# Patient Record
Sex: Female | Born: 1950 | Race: White | Hispanic: No | Marital: Married | Smoking: Never smoker
Health system: Southern US, Community
[De-identification: ages and names within clinical notes are randomized; demographics above are authoritative.]

## PROBLEM LIST (undated history)

## (undated) DIAGNOSIS — M1990 Unspecified osteoarthritis, unspecified site: Secondary | ICD-10-CM

## (undated) DIAGNOSIS — M8580 Other specified disorders of bone density and structure, unspecified site: Secondary | ICD-10-CM

## (undated) DIAGNOSIS — E785 Hyperlipidemia, unspecified: Secondary | ICD-10-CM

## (undated) DIAGNOSIS — D259 Leiomyoma of uterus, unspecified: Secondary | ICD-10-CM

## (undated) DIAGNOSIS — E039 Hypothyroidism, unspecified: Secondary | ICD-10-CM

## (undated) HISTORY — DX: Hypothyroidism, unspecified: E03.9

## (undated) HISTORY — DX: Other specified disorders of bone density and structure, unspecified site: M85.80

## (undated) HISTORY — DX: Hyperlipidemia, unspecified: E78.5

## (undated) HISTORY — PX: THYROIDECTOMY, PARTIAL: SHX18

## (undated) HISTORY — DX: Unspecified osteoarthritis, unspecified site: M19.90

## (undated) HISTORY — DX: Leiomyoma of uterus, unspecified: D25.9

---

## 2008-04-24 ENCOUNTER — Encounter: Admission: RE | Admit: 2008-04-24 | Discharge: 2008-04-24 | Payer: Self-pay | Admitting: Family Medicine

## 2008-12-31 ENCOUNTER — Other Ambulatory Visit: Admission: RE | Admit: 2008-12-31 | Discharge: 2008-12-31 | Payer: Self-pay | Admitting: Family Medicine

## 2009-07-25 ENCOUNTER — Ambulatory Visit: Payer: Self-pay | Admitting: Radiology

## 2009-07-25 ENCOUNTER — Ambulatory Visit (HOSPITAL_BASED_OUTPATIENT_CLINIC_OR_DEPARTMENT_OTHER): Admission: RE | Admit: 2009-07-25 | Discharge: 2009-07-25 | Payer: Self-pay | Admitting: Nurse Practitioner

## 2010-03-19 ENCOUNTER — Ambulatory Visit: Payer: Self-pay | Admitting: Diagnostic Radiology

## 2010-03-19 ENCOUNTER — Ambulatory Visit (HOSPITAL_BASED_OUTPATIENT_CLINIC_OR_DEPARTMENT_OTHER): Admission: RE | Admit: 2010-03-19 | Discharge: 2010-03-19 | Payer: Self-pay | Admitting: Family Medicine

## 2010-09-26 IMAGING — CT CT CHEST W/ CM
2 of 3 series · 15 of 36 positions shown, 18 images · IV contrast (APPLIED)
Comparison: None.

CLINICAL DATA: Palpable abnormality in the right sternoclavicular
joint.  Prior thyroidectomy.

CT CHEST WITH CONTRAST
TECHNIQUE: Multidetector CT imaging of the chest was performed
following the standard protocol during bolus administration of
intravenous contrast.
Contrast: 100 ml Omniscan 300 IV contrast

[Series 2: chest 5.0 b31f · axial · 0.74mm/px · z∈[-335,-45]mm · 12 of 68 slices shown, 15 images]
[im 5/68  mediastinal]
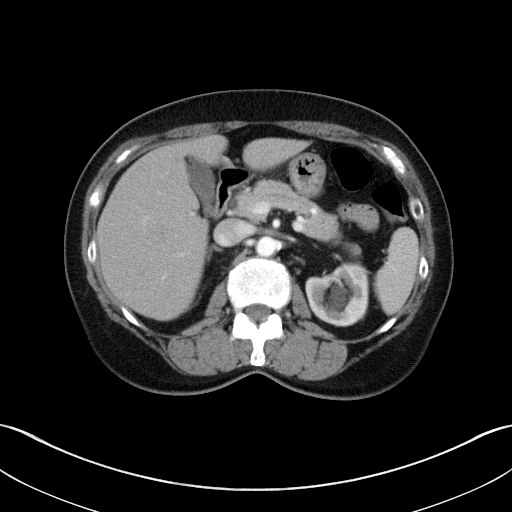
[im 5/68  lung]
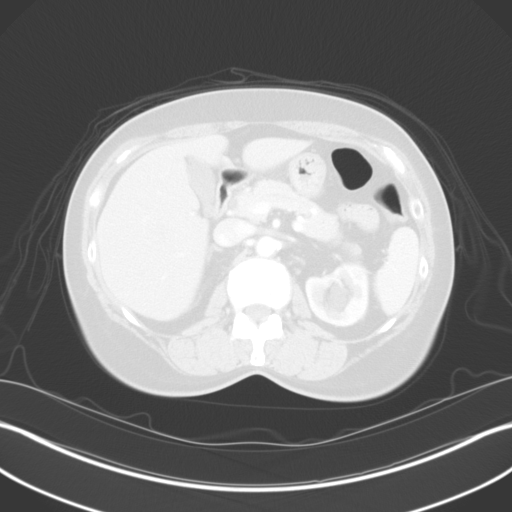
[im 10/68  lung]
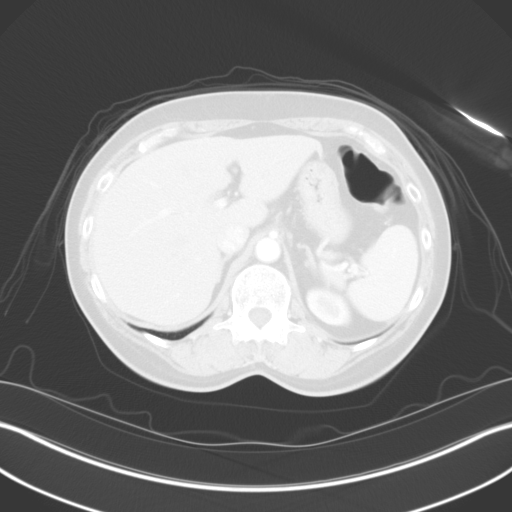
[im 15/68  lung]
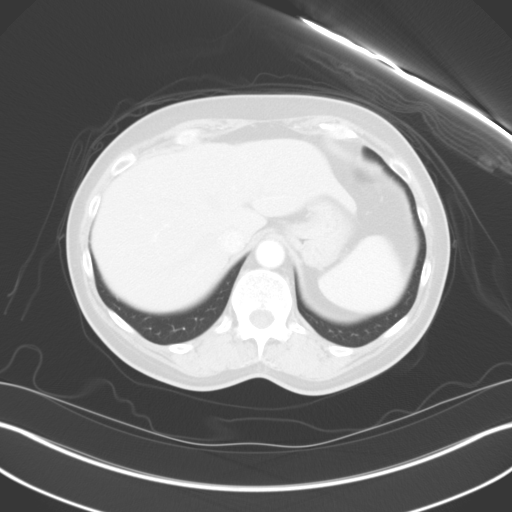
[im 20/68  lung]
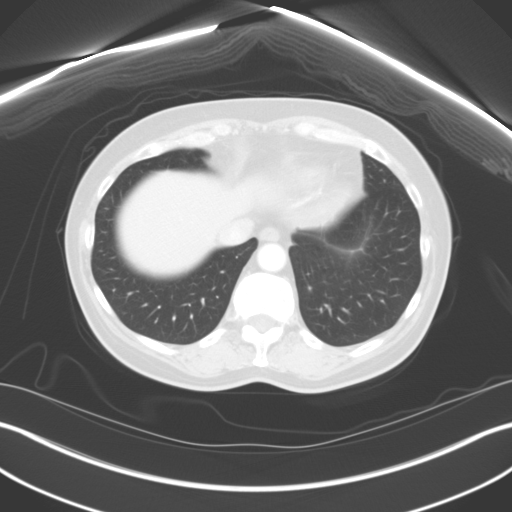
[im 25/68  mediastinal]
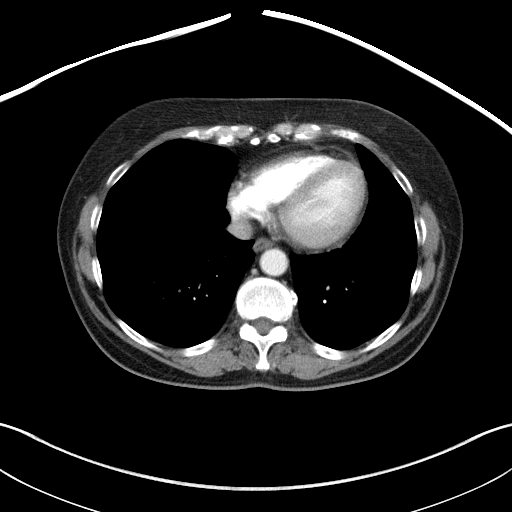
[im 25/68  lung]
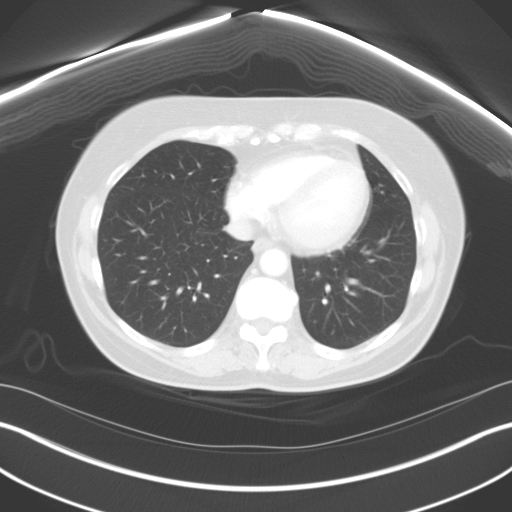
[im 30/68  lung]
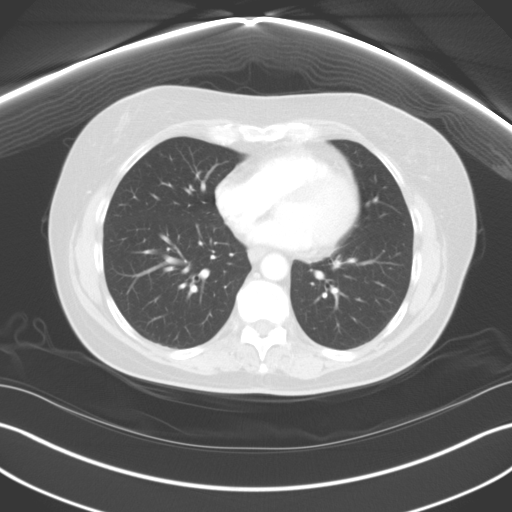
[im 38/68  lung]
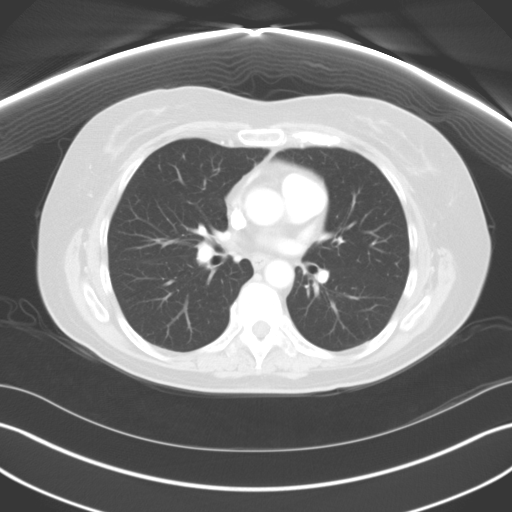
[im 43/68  lung]
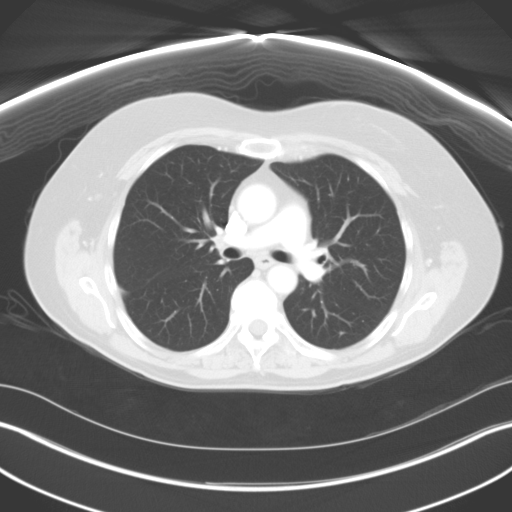
[im 48/68  mediastinal]
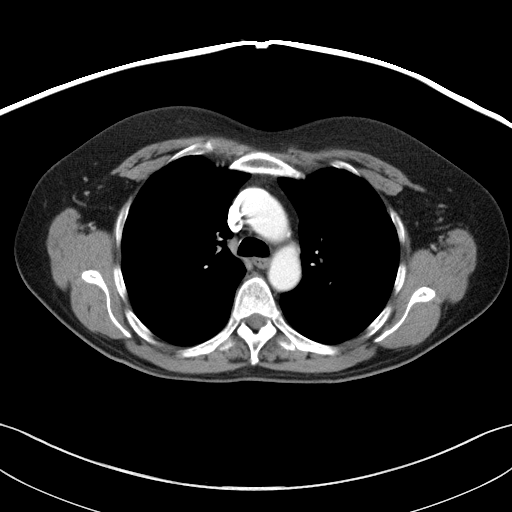
[im 48/68  lung]
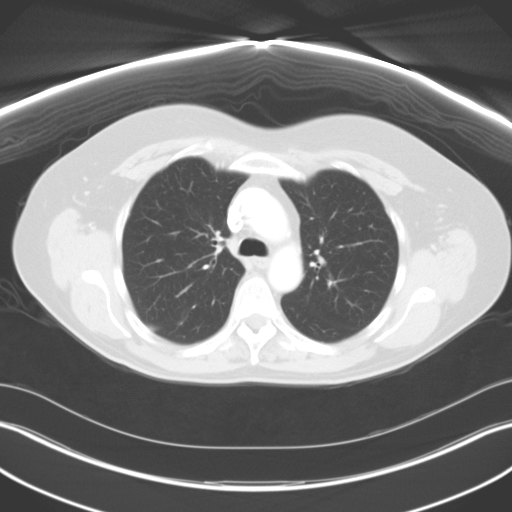
[im 53/68  lung]
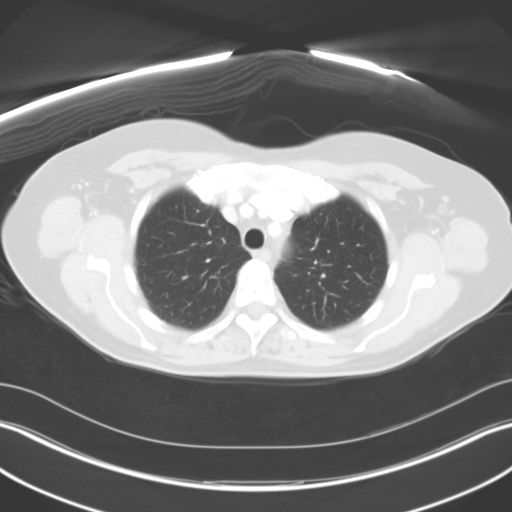
[im 58/68  lung]
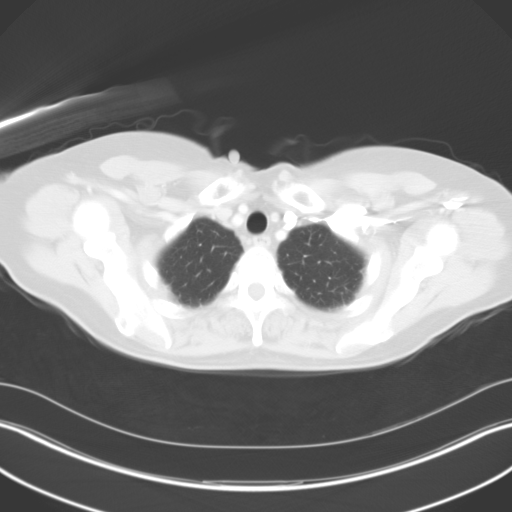
[im 63/68  lung]
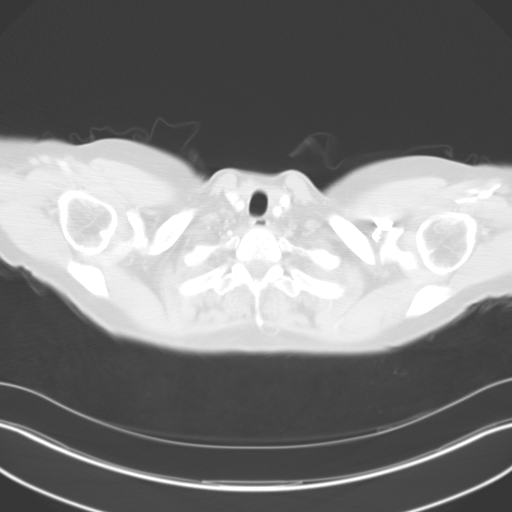

[Series 6: chest 3.0 coronal · coronal · 0.67mm/px · 3 of 75 slices shown]
[im 15/75  lung]
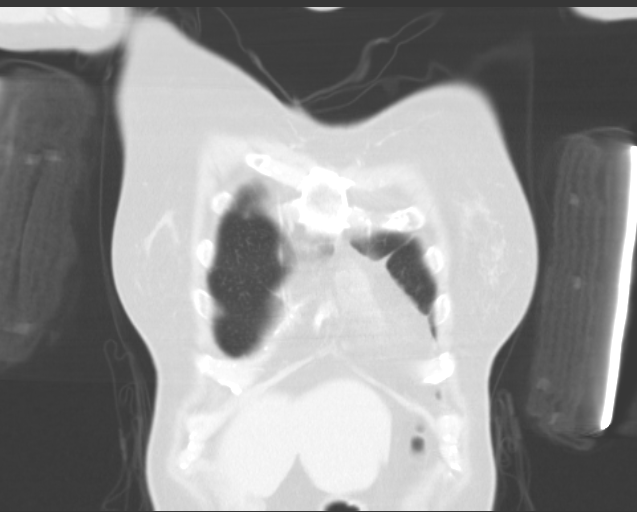
[im 30/75  lung]
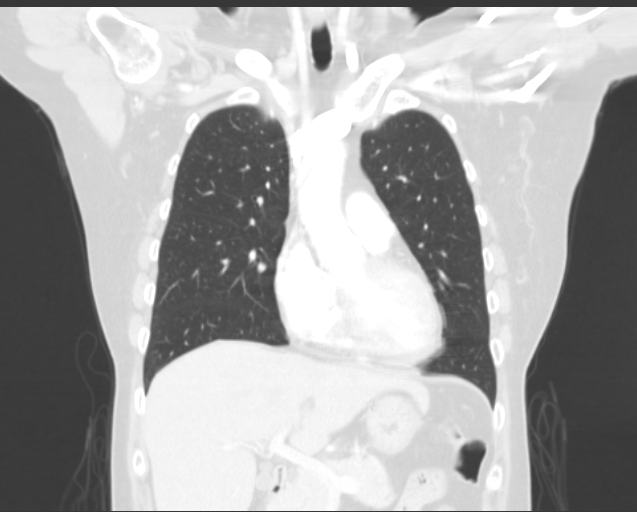
[im 45/75  lung]
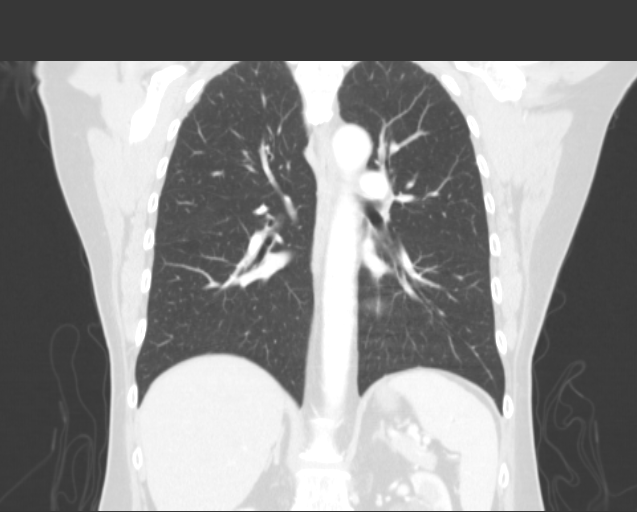

[15 of 36 positions shown; findings below may reference images not displayed]

FINDINGS: Clip in the right thyroidectomy bed region is at the
visualized.  The sternoclavicular joints are unremarkable.  No bony
abnormality is seen deep to the area of clinical concern as marked
by a vitamin E capsule. 2 mm nodule in the superior segment right
lower lobe is likely of no clinical consequence.  The left lung is
clear.  Heart size is normal.  No lymphadenopathy.  No pericardial
or pleural effusion.

Incomplete imaging of the upper abdomen demonstrates apparent
fullness of the left intrarenal collecting system, not evaluated.
IMPRESSION: No intrathoracic abnormality.  Specifically, the right
sternoclavicular joint at the site of the patient's questioned
palpable finding appears unremarkable.

Incomplete visualization of the left kidney demonstrates possible
prominence of the intrarenal collecting system.  Consider renal
ultrasound for further evaluation.

## 2011-01-20 ENCOUNTER — Other Ambulatory Visit: Payer: Self-pay | Admitting: Internal Medicine

## 2011-01-20 DIAGNOSIS — Z139 Encounter for screening, unspecified: Secondary | ICD-10-CM

## 2011-01-22 ENCOUNTER — Ambulatory Visit: Payer: 59 | Admitting: Internal Medicine

## 2011-01-22 ENCOUNTER — Other Ambulatory Visit: Payer: Self-pay | Admitting: Internal Medicine

## 2011-01-22 DIAGNOSIS — D179 Benign lipomatous neoplasm, unspecified: Secondary | ICD-10-CM

## 2011-01-22 DIAGNOSIS — R93429 Abnormal radiologic findings on diagnostic imaging of unspecified kidney: Secondary | ICD-10-CM

## 2011-01-28 ENCOUNTER — Ambulatory Visit (HOSPITAL_BASED_OUTPATIENT_CLINIC_OR_DEPARTMENT_OTHER)
Admission: RE | Admit: 2011-01-28 | Discharge: 2011-01-28 | Disposition: A | Discharge status: Home / Self Care | Payer: 59 | Source: Ambulatory Visit | Attending: Internal Medicine | Admitting: Internal Medicine

## 2011-01-28 ENCOUNTER — Ambulatory Visit (HOSPITAL_BASED_OUTPATIENT_CLINIC_OR_DEPARTMENT_OTHER): Payer: Self-pay

## 2011-01-28 ENCOUNTER — Ambulatory Visit (INDEPENDENT_AMBULATORY_CARE_PROVIDER_SITE_OTHER)
Admission: RE | Admit: 2011-01-28 | Discharge: 2011-01-28 | Disposition: A | Discharge status: Home / Self Care | Payer: 59 | Source: Ambulatory Visit | Attending: Internal Medicine | Admitting: Internal Medicine

## 2011-01-28 DIAGNOSIS — N289 Disorder of kidney and ureter, unspecified: Secondary | ICD-10-CM | POA: Insufficient documentation

## 2011-01-28 DIAGNOSIS — R9389 Abnormal findings on diagnostic imaging of other specified body structures: Secondary | ICD-10-CM

## 2011-01-28 DIAGNOSIS — Z1231 Encounter for screening mammogram for malignant neoplasm of breast: Secondary | ICD-10-CM | POA: Insufficient documentation

## 2011-01-28 DIAGNOSIS — Z139 Encounter for screening, unspecified: Secondary | ICD-10-CM

## 2011-01-28 DIAGNOSIS — R93429 Abnormal radiologic findings on diagnostic imaging of unspecified kidney: Secondary | ICD-10-CM

## 2011-02-03 ENCOUNTER — Other Ambulatory Visit: Payer: Self-pay | Admitting: Internal Medicine

## 2011-02-03 ENCOUNTER — Ambulatory Visit: Payer: 59 | Admitting: Internal Medicine

## 2011-02-03 ENCOUNTER — Other Ambulatory Visit: Payer: Self-pay

## 2011-02-03 DIAGNOSIS — E039 Hypothyroidism, unspecified: Secondary | ICD-10-CM

## 2011-02-03 DIAGNOSIS — M129 Arthropathy, unspecified: Secondary | ICD-10-CM

## 2011-02-03 DIAGNOSIS — Z1272 Encounter for screening for malignant neoplasm of vagina: Secondary | ICD-10-CM

## 2011-02-03 DIAGNOSIS — E785 Hyperlipidemia, unspecified: Secondary | ICD-10-CM

## 2011-02-03 DIAGNOSIS — Z113 Encounter for screening for infections with a predominantly sexual mode of transmission: Secondary | ICD-10-CM

## 2011-02-03 DIAGNOSIS — Z780 Asymptomatic menopausal state: Secondary | ICD-10-CM

## 2011-02-10 ENCOUNTER — Ambulatory Visit
Admission: RE | Admit: 2011-02-10 | Discharge: 2011-02-10 | Disposition: A | Discharge status: Home / Self Care | Payer: 59 | Source: Ambulatory Visit | Attending: Internal Medicine | Admitting: Internal Medicine

## 2011-02-10 ENCOUNTER — Other Ambulatory Visit: Payer: 59

## 2011-02-10 DIAGNOSIS — Z780 Asymptomatic menopausal state: Secondary | ICD-10-CM

## 2011-02-24 ENCOUNTER — Other Ambulatory Visit: Payer: Self-pay | Admitting: Internal Medicine

## 2011-04-15 ENCOUNTER — Encounter: Payer: Self-pay | Admitting: Internal Medicine

## 2011-12-31 ENCOUNTER — Telehealth: Payer: Self-pay | Admitting: Emergency Medicine

## 2011-12-31 DIAGNOSIS — Z1211 Encounter for screening for malignant neoplasm of colon: Secondary | ICD-10-CM

## 2011-12-31 NOTE — Telephone Encounter (Signed)
Yes OK for referral  Can you take care of setting this up?

## 2011-12-31 NOTE — Telephone Encounter (Signed)
Screening pre-op appointment with the nurse scheduled 01/28/12 @ 200/130pm arrival time.  Procedure date 02/04/12 @ 1030am with Dr. Rhetta Mura.  [name] aware of appt dates/times.  Number given to her in case she needs to reschedule appointment.

## 2011-12-31 NOTE — Telephone Encounter (Signed)
Tammy Spears called, stating it is time for her repeat colonoscopy.  Her last colon was 10 years ago in [place].  She would like to go to [facility] in [place].  Since she has not been there, I told her she would likely need a referral from our office.  She is due for CPE in March.  Okay to go ahead and make referral.  Tammy Spears would like to go ahead and have colon before CPE so you will have results at CPE.

## 2012-02-03 ENCOUNTER — Encounter: Payer: Self-pay | Admitting: Internal Medicine

## 2012-02-09 ENCOUNTER — Encounter: Payer: Self-pay | Admitting: Internal Medicine

## 2012-03-23 ENCOUNTER — Telehealth: Payer: Self-pay | Admitting: Internal Medicine

## 2012-03-23 NOTE — Telephone Encounter (Signed)
Called Tammy Spears and informed her we can order her blood work before her visit- but since physical isn't until 05/24/12 would prefer to wait until closer.  Tammy Spears states would like to get it done while she is out of work the week of June 16 , also mammogram- infomred her that would be fine- but too soon to put in orders now- so instructed her to call a few days ahead and we will put in the orders.  Tammy Spears voices understanding

## 2012-03-23 NOTE — Telephone Encounter (Signed)
Pt would like to get her blood work done before her physical on 05/24/2012. Pt's number [phone]

## 2012-04-21 ENCOUNTER — Other Ambulatory Visit: Payer: Self-pay | Admitting: Internal Medicine

## 2012-04-21 DIAGNOSIS — Z1231 Encounter for screening mammogram for malignant neoplasm of breast: Secondary | ICD-10-CM

## 2012-05-03 ENCOUNTER — Other Ambulatory Visit: Payer: Self-pay | Admitting: *Deleted

## 2012-05-03 MED ORDER — LEVOTHYROXINE SODIUM 100 MCG PO TABS: 100.0000 ug | ORAL_TABLET | Freq: Every day | ORAL | Status: DC

## 2012-05-03 NOTE — Telephone Encounter (Signed)
Pt called requesting a written RX for her Synthroid.  She is coming on 6/24 for fasting labs and her CPE is not until July.  She will run out before then.  She does want the RX to be a printed copy.

## 2012-05-09 ENCOUNTER — Ambulatory Visit (HOSPITAL_BASED_OUTPATIENT_CLINIC_OR_DEPARTMENT_OTHER)
Admission: RE | Admit: 2012-05-09 | Discharge: 2012-05-09 | Disposition: A | Discharge status: Home / Self Care | Payer: 59 | Source: Ambulatory Visit | Attending: Internal Medicine | Admitting: Internal Medicine

## 2012-05-09 ENCOUNTER — Other Ambulatory Visit: Payer: 59

## 2012-05-09 DIAGNOSIS — Z1231 Encounter for screening mammogram for malignant neoplasm of breast: Secondary | ICD-10-CM | POA: Insufficient documentation

## 2012-05-09 DIAGNOSIS — E039 Hypothyroidism, unspecified: Secondary | ICD-10-CM

## 2012-05-09 DIAGNOSIS — Z01419 Encounter for gynecological examination (general) (routine) without abnormal findings: Secondary | ICD-10-CM

## 2012-05-09 LAB — COMPLETE METABOLIC PANEL WITH GFR
ALT: 19 U/L (ref 0–35)
AST: 19 U/L (ref 0–37)
Albumin: 4.6 g/dL (ref 3.5–5.2)
Calcium: 9.4 mg/dL (ref 8.4–10.5)
GFR, Est Non African American: 70 mL/min
Potassium: 4.4 mEq/L (ref 3.5–5.3)
Sodium: 140 mEq/L (ref 135–145)
Total Protein: 7 g/dL (ref 6.0–8.3)

## 2012-05-09 LAB — CBC WITH DIFFERENTIAL/PLATELET
Basophils Absolute: 0 10*3/uL (ref 0.0–0.1)
Basophils Relative: 1 % (ref 0–1)
HCT: 40.4 % (ref 36.0–46.0)
Lymphs Abs: 1.3 10*3/uL (ref 0.7–4.0)
MCHC: 33.9 g/dL (ref 30.0–36.0)
MCV: 94.6 fL (ref 78.0–100.0)
Monocytes Relative: 7 % (ref 3–12)
Neutro Abs: 2.2 10*3/uL (ref 1.7–7.7)
RBC: 4.27 MIL/uL (ref 3.87–5.11)
RDW: 13.8 % (ref 11.5–15.5)
WBC: 3.8 10*3/uL — ABNORMAL LOW (ref 4.0–10.5)

## 2012-05-09 LAB — LIPID PANEL
Cholesterol: 216 mg/dL — ABNORMAL HIGH (ref 0–200)
LDL Cholesterol: 139 mg/dL — ABNORMAL HIGH (ref 0–99)
VLDL: 23 mg/dL (ref 0–40)

## 2012-05-09 MED ORDER — LEVOTHYROXINE SODIUM 100 MCG PO TABS: 100.0000 ug | ORAL_TABLET | Freq: Every day | ORAL | Status: DC

## 2012-05-09 NOTE — Addendum Note (Signed)
Addended by: Granville Lewis on: 05/09/2012 10:13 AM   Modules accepted: Orders

## 2012-05-10 ENCOUNTER — Encounter: Payer: Self-pay | Admitting: *Deleted

## 2012-05-10 LAB — VITAMIN D 25 HYDROXY (VIT D DEFICIENCY, FRACTURES): Vit D, 25-Hydroxy: 41 ng/mL (ref 30–89)

## 2012-05-24 ENCOUNTER — Telehealth: Payer: Self-pay | Admitting: Internal Medicine

## 2012-05-24 ENCOUNTER — Ambulatory Visit (INDEPENDENT_AMBULATORY_CARE_PROVIDER_SITE_OTHER): Payer: 59 | Admitting: Internal Medicine

## 2012-05-24 ENCOUNTER — Encounter: Payer: Self-pay | Admitting: Internal Medicine

## 2012-05-24 VITALS — BP 110/70 | HR 64 | Temp 97.6°F | Resp 16 | Ht 62.0 in | Wt 131.0 lb

## 2012-05-24 DIAGNOSIS — E785 Hyperlipidemia, unspecified: Secondary | ICD-10-CM | POA: Insufficient documentation

## 2012-05-24 DIAGNOSIS — M8580 Other specified disorders of bone density and structure, unspecified site: Secondary | ICD-10-CM

## 2012-05-24 DIAGNOSIS — D219 Benign neoplasm of connective and other soft tissue, unspecified: Secondary | ICD-10-CM | POA: Insufficient documentation

## 2012-05-24 DIAGNOSIS — Z0000 Encounter for general adult medical examination without abnormal findings: Secondary | ICD-10-CM

## 2012-05-24 DIAGNOSIS — D259 Leiomyoma of uterus, unspecified: Secondary | ICD-10-CM

## 2012-05-24 DIAGNOSIS — E039 Hypothyroidism, unspecified: Secondary | ICD-10-CM

## 2012-05-24 DIAGNOSIS — M899 Disorder of bone, unspecified: Secondary | ICD-10-CM

## 2012-05-24 DIAGNOSIS — Z23 Encounter for immunization: Secondary | ICD-10-CM

## 2012-05-24 DIAGNOSIS — Z01419 Encounter for gynecological examination (general) (routine) without abnormal findings: Secondary | ICD-10-CM

## 2012-05-24 DIAGNOSIS — M1990 Unspecified osteoarthritis, unspecified site: Secondary | ICD-10-CM | POA: Insufficient documentation

## 2012-05-24 LAB — POCT URINALYSIS DIPSTICK
Bilirubin, UA: NEGATIVE
Glucose, UA: NEGATIVE
Ketones, UA: NEGATIVE
Leukocytes, UA: NEGATIVE
Nitrite, UA: NEGATIVE
Spec Grav, UA: 1.02
Urobilinogen, UA: NEGATIVE
pH, UA: 6

## 2012-05-24 MED ORDER — TETANUS-DIPHTH-ACELL PERTUSSIS 5-2.5-18.5 LF-MCG/0.5 IM SUSP: 0.5000 mL | Freq: Once | INTRAMUSCULAR | Status: AC

## 2012-05-24 NOTE — Patient Instructions (Addendum)
See me as needed 

## 2012-05-24 NOTE — Progress Notes (Signed)
Subjective:    Patient ID: Tammy Spears, female    DOB: 07-Aug-1951, 62 y.o.   MRN: [id number]  HPI Tammy Spears is here for comprehensive exam.  Overall doing well.  She is caring for 66 grandsons youngest age 27 months.  She has not had a pertussis vaccine as yet.  Tetanus done 4 years ago.    Has scaly rash around nose and scalp.  Dermatologist Dr. Massie Maroon advised to use Head and Shoulders shampoo .  She has upcoming follow up with him.    See labs.  Eating well .  Does not wish any meds for LDL.  She has mild elevation  No Known Allergies Past Medical History  Diagnosis Date  . Hypothyroidism   . Arthritis   . Uterine fibroid   . Hyperlipidemia   . Osteopenia    Past Surgical History  Procedure Date  . Thyroidectomy, partial     goiter   History   Social History  . Marital Status: Married    Spouse Name: Merlyn Albert    Number of Children: 2  . Years of Education: Masters   Occupational History  . Retired    Social History Main Topics  . Smoking status: Never Smoker   . Smokeless tobacco: Never Used  . Alcohol Use: 1.2 oz/week    2 Glasses of wine per week  . Drug Use: No  . Sexually Active: Yes -- Female partner(s)   Other Topics Concern  . Not on file   Social History Narrative  . No narrative on file   Family History  Problem Relation Age of Onset  . Heart failure Mother   . Breast cancer Mother   . Heart attack Father    There is no problem list on file for this patient.  Current Outpatient Prescriptions on File Prior to Visit  Medication Sig Dispense Refill  . Calcium Carbonate-Vitamin D (CALCIUM 500 + D PO) Take 1 tablet by mouth daily.        Marland Kitchen glucosamine-chondroitin 500-400 MG tablet Take 1 tablet by mouth daily.        Marland Kitchen levothyroxine (SYNTHROID, LEVOTHROID) 100 MCG tablet Take 1 tablet (100 mcg total) by mouth daily.  30 tablet  1  . Multiple Vitamin (MULTIVITAMIN) tablet Take 1 tablet by mouth daily.            Review of Systems  Skin: Positive for  rash.  All other systems reviewed and are negative.       Objective:   Physical Exam Physical Exam  Nursing note and vitals reviewed.  Constitutional: She is oriented to person, place, and time. She appears well-developed and well-nourished.  HENT:  Head: Normocephalic and atraumatic.  Right Ear: Tympanic membrane and ear canal normal. No drainage. Tympanic membrane is not injected and not erythematous.  Left Ear: Tympanic membrane and ear canal normal. No drainage. Tympanic membrane is not injected and not erythematous.  Nose: Nose normal. Right sinus exhibits no maxillary sinus tenderness and no frontal sinus tenderness. Left sinus exhibits no maxillary sinus tenderness and no frontal sinus tenderness.  Mouth/Throat: Oropharynx is clear and moist. No oral lesions. No oropharyngeal exudate.  Eyes: Conjunctivae and EOM are normal. Pupils are equal, round, and reactive to light.  Neck: Normal range of motion. Neck supple. No JVD present. Carotid bruit is not present. No mass and no thyromegaly present.  Cardiovascular: Normal rate, regular rhythm, S1 normal, S2 normal and intact distal pulses. Exam reveals no gallop and no friction  rub.  No murmur heard.  Pulses:  Carotid pulses are 2+ on the right side, and 2+ on the left side.  Dorsalis pedis pulses are 2+ on the right side, and 2+ on the left side.  No carotid bruit. No LE edema  Pulmonary/Chest: Breath sounds normal. She has no wheezes. She has no rales. She exhibits no tenderness. Breast no discrete masses no nipple discharge no axillary adenopathy bilaterally Abdominal: Soft. Bowel sounds are normal. She exhibits no distension and no mass. There is no hepatosplenomegaly. There is no tenderness. There is no CVA tenderness.  Pelvic bimanual only  No adnexal masses or tenderness   Musculoskeletal: Normal range of motion.  No active synovitis to joints.  Lymphadenopathy:  She has no cervical adenopathy.  She has no axillary  adenopathy.  Right: No inguinal and no supraclavicular adenopathy present.  Left: No inguinal and no supraclavicular adenopathy present.  Neurological: She is alert and oriented to person, place, and time. She has normal strength and normal reflexes. She displays no tremor. No cranial nerve deficit or sensory deficit. Coordination and gait normal.  Skin: Skin is warm and dry. No rash noted. No cyanosis. Nails show no clubbing. Few scaly lesions around nose Psychiatric: She has a normal mood and affect. Her speech is normal and behavior is normal. Cognition and memory are normal.           Assessment & Plan:  Health maintenance .  Since caregiver for 40 month old will give Tdap today.  Advised to ice, Ibuprofen prn  Hypothtyroidism  TSH normal  Scaly lesion nares  Follow up with dermatologist possible seb der  Hyperlipidemia  Minimal dash diet given  DJD  Osteopenia calcium vitamin D  Due for dexa  2014  Spent 45 mintues with pt

## 2012-05-24 NOTE — Telephone Encounter (Signed)
Tammy Spears  Please get Dr. Rhetta Mura colonoscopy report done in may  Thanks

## 2012-07-19 ENCOUNTER — Other Ambulatory Visit: Payer: Self-pay | Admitting: Internal Medicine

## 2012-07-19 DIAGNOSIS — E039 Hypothyroidism, unspecified: Secondary | ICD-10-CM

## 2012-07-19 MED ORDER — LEVOTHYROXINE SODIUM 100 MCG PO TABS: 100.0000 ug | ORAL_TABLET | Freq: Every day | ORAL | Status: DC

## 2012-07-19 NOTE — Telephone Encounter (Signed)
Pt states she only has ONE Levothyroxine Sodium (Tab) SYNTHROID, LEVOTHROID 100 MCG Take 1 tablet (100 mcg total) by mouth daily.  Left... She needs  Year prescription and would like if the doctor can prescribe [facility] day at [facility] at [facility]... THANKS

## 2012-07-20 ENCOUNTER — Other Ambulatory Visit: Payer: Self-pay | Admitting: *Deleted

## 2012-07-20 DIAGNOSIS — E039 Hypothyroidism, unspecified: Secondary | ICD-10-CM

## 2012-07-20 NOTE — Telephone Encounter (Signed)
Tammy Spears  I did this yesterday

## 2012-07-20 NOTE — Telephone Encounter (Signed)
Called pt RX for synthroid into [facility]

## 2012-07-20 NOTE — Telephone Encounter (Signed)
Pt requesting a rx synthroid x 12 refills in 3 month increments for insurance purposes

## 2012-08-01 ENCOUNTER — Telehealth: Payer: Self-pay | Admitting: *Deleted

## 2012-08-01 NOTE — Telephone Encounter (Signed)
Refill  Completed by Dr Constance Goltz

## 2012-08-03 NOTE — Telephone Encounter (Signed)
Medication refill completed by MD

## 2012-10-17 ENCOUNTER — Other Ambulatory Visit: Payer: Self-pay | Admitting: Internal Medicine

## 2012-10-17 NOTE — Telephone Encounter (Signed)
Tammy Spears  Call pt and confirm the pharmacy she goes to.  I ordered 90 tablets with 3 refills on 9/30

## 2012-10-17 NOTE — Telephone Encounter (Signed)
PT WOULD LIKE TO HAVE HER Levothyroxine Sodium (Tab) SYNTHROID, LEVOTHROID 100 MCG Take 1 tablet (100 mcg total) by mouth daily. REFILL... SHE STATES THERE HAVE BEEN FAXES SENT TO OUR OFFICE.Marland Kitchen   Pt is not happy with having to go thru this again... Please touch base with pt.. Thanks

## 2012-10-18 ENCOUNTER — Other Ambulatory Visit: Payer: Self-pay | Admitting: *Deleted

## 2013-04-18 ENCOUNTER — Other Ambulatory Visit: Payer: Self-pay | Admitting: *Deleted

## 2013-04-18 DIAGNOSIS — E039 Hypothyroidism, unspecified: Secondary | ICD-10-CM

## 2013-04-18 DIAGNOSIS — E785 Hyperlipidemia, unspecified: Secondary | ICD-10-CM

## 2013-04-18 DIAGNOSIS — M8580 Other specified disorders of bone density and structure, unspecified site: Secondary | ICD-10-CM

## 2013-04-18 LAB — CBC WITH DIFFERENTIAL/PLATELET
Basophils Absolute: 0 10*3/uL (ref 0.0–0.1)
Basophils Relative: 1 % (ref 0–1)
Eosinophils Absolute: 0.1 10*3/uL (ref 0.0–0.7)
Eosinophils Relative: 1 % (ref 0–5)
HCT: 40.8 % (ref 36.0–46.0)
Hemoglobin: 14.2 g/dL (ref 12.0–15.0)
Lymphs Abs: 1.3 10*3/uL (ref 0.7–4.0)
MCH: 31.8 pg (ref 26.0–34.0)
MCHC: 34.8 g/dL (ref 30.0–36.0)
MCV: 91.3 fL (ref 78.0–100.0)
Neutrophils Relative %: 54 % (ref 43–77)
RDW: 13.3 % (ref 11.5–15.5)

## 2013-04-18 LAB — TSH: TSH: 0.886 u[IU]/mL (ref 0.350–4.500)

## 2013-04-18 LAB — COMPREHENSIVE METABOLIC PANEL
ALT: 17 U/L (ref 0–35)
AST: 17 U/L (ref 0–37)
Alkaline Phosphatase: 80 U/L (ref 39–117)
Chloride: 105 mEq/L (ref 96–112)
Glucose, Bld: 77 mg/dL (ref 70–99)
Potassium: 4.4 mEq/L (ref 3.5–5.3)
Total Bilirubin: 0.4 mg/dL (ref 0.3–1.2)

## 2013-04-18 LAB — LIPID PANEL
Cholesterol: 201 mg/dL — ABNORMAL HIGH (ref 0–200)
VLDL: 21 mg/dL (ref 0–40)

## 2013-04-19 LAB — VITAMIN D 25 HYDROXY (VIT D DEFICIENCY, FRACTURES): Vit D, 25-Hydroxy: 50 ng/mL (ref 30–89)

## 2013-04-24 ENCOUNTER — Encounter: Payer: Self-pay | Admitting: Internal Medicine

## 2013-04-24 ENCOUNTER — Ambulatory Visit (HOSPITAL_BASED_OUTPATIENT_CLINIC_OR_DEPARTMENT_OTHER): Payer: 59

## 2013-04-24 ENCOUNTER — Ambulatory Visit (INDEPENDENT_AMBULATORY_CARE_PROVIDER_SITE_OTHER): Payer: 59 | Admitting: Internal Medicine

## 2013-04-24 ENCOUNTER — Other Ambulatory Visit: Payer: Self-pay | Admitting: Internal Medicine

## 2013-04-24 VITALS — BP 121/61 | HR 60 | Temp 97.9°F | Resp 18 | Wt 133.0 lb

## 2013-04-24 DIAGNOSIS — Z0000 Encounter for general adult medical examination without abnormal findings: Secondary | ICD-10-CM

## 2013-04-24 DIAGNOSIS — E785 Hyperlipidemia, unspecified: Secondary | ICD-10-CM

## 2013-04-24 DIAGNOSIS — R0789 Other chest pain: Secondary | ICD-10-CM

## 2013-04-24 DIAGNOSIS — M1990 Unspecified osteoarthritis, unspecified site: Secondary | ICD-10-CM

## 2013-04-24 DIAGNOSIS — E039 Hypothyroidism, unspecified: Secondary | ICD-10-CM

## 2013-04-24 DIAGNOSIS — M899 Disorder of bone, unspecified: Secondary | ICD-10-CM

## 2013-04-24 DIAGNOSIS — Z1151 Encounter for screening for human papillomavirus (HPV): Secondary | ICD-10-CM

## 2013-04-24 DIAGNOSIS — M8580 Other specified disorders of bone density and structure, unspecified site: Secondary | ICD-10-CM

## 2013-04-24 DIAGNOSIS — Z124 Encounter for screening for malignant neoplasm of cervix: Secondary | ICD-10-CM

## 2013-04-24 DIAGNOSIS — Z803 Family history of malignant neoplasm of breast: Secondary | ICD-10-CM

## 2013-04-24 DIAGNOSIS — M949 Disorder of cartilage, unspecified: Secondary | ICD-10-CM

## 2013-04-24 DIAGNOSIS — Z139 Encounter for screening, unspecified: Secondary | ICD-10-CM

## 2013-04-24 LAB — POCT URINALYSIS DIPSTICK
Blood, UA: NEGATIVE
Ketones, UA: NEGATIVE
Protein, UA: NEGATIVE
Spec Grav, UA: 1.015
Urobilinogen, UA: NEGATIVE

## 2013-04-24 MED ORDER — LEVOTHYROXINE SODIUM 100 MCG PO TABS: 100.0000 ug | ORAL_TABLET | Freq: Every day | ORAL | Status: DC

## 2013-04-24 NOTE — Patient Instructions (Addendum)
See me as needed  Will schedule DEXA  Will refer to cardiology

## 2013-04-24 NOTE — Progress Notes (Signed)
Subjective:    Patient ID: Tammy Spears, female    DOB: 02/15/1951, 62 y.o.   MRN: [id number]  HPI Dennie Bible is here for CPE.  She reports she had episode of palpitaitons and chest pressure several months ago.  Non exertional.  Symptoms on and off for 1 and 1/2 days.   She did not seek medical attention at that time -   Attributed symptoms to stress (younger brother had CVA and brother in law had bladder cancer).  No recent symptoms  Risk profile  She is a non-smoker.  Father had MI in his 32's, younger brother CVA.  She has mild hyperlipidemia  And does not want medication for this.    She still care for 3 grandsons age 76 year to 6 years.    Both mother ( age 82) and recently her niece  (age 44) was diagnosed with breast cancer   No Known Allergies Past Medical History  Diagnosis Date  . Hypothyroidism   . Arthritis   . Uterine fibroid   . Hyperlipidemia   . Osteopenia    Past Surgical History  Procedure Laterality Date  . Thyroidectomy, partial      goiter   History   Social History  . Marital Status: Married    Spouse Name: Merlyn Albert    Number of Children: 2  . Years of Education: Masters   Occupational History  . Retired    Social History Main Topics  . Smoking status: Never Smoker   . Smokeless tobacco: Never Used  . Alcohol Use: 1.2 oz/week    2 Glasses of wine per week  . Drug Use: No  . Sexually Active: Yes -- Female partner(s)   Other Topics Concern  . Not on file   Social History Narrative  . No narrative on file   Family History  Problem Relation Age of Onset  . Heart failure Mother   . Breast cancer Mother   . Heart attack Father    Patient Active Problem List   Diagnosis Date Noted  . Unspecified hypothyroidism 05/24/2012  . Fibroids 05/24/2012  . Other and unspecified hyperlipidemia 05/24/2012  . DJD (degenerative joint disease) 05/24/2012  . Osteopenia 05/24/2012   Current Outpatient Prescriptions on File Prior to Visit  Medication Sig  Dispense Refill  . Calcium Carbonate-Vitamin D (CALCIUM 500 + D PO) Take 1 tablet by mouth daily.        . fish oil-omega-3 fatty acids 1000 MG capsule Take 1 g by mouth daily.      Marland Kitchen glucosamine-chondroitin 500-400 MG tablet Take 1 tablet by mouth daily.        Marland Kitchen levothyroxine (SYNTHROID, LEVOTHROID) 100 MCG tablet Take 1 tablet (100 mcg total) by mouth daily.  90 tablet  3  . Multiple Vitamin (MULTIVITAMIN) tablet Take 1 tablet by mouth daily.         Current Facility-Administered Medications on File Prior to Visit  Medication Dose Route Frequency Provider Last Rate Last Dose  . TDaP (BOOSTRIX) injection 0.5 mL  0.5 mL Intramuscular Once Kendrick Ranch, MD           Review of Systems See HPI    Objective:   Physical Exam Physical Exam  Vital signs and nursing note reviewed  Constitutional: She is oriented to person, place, and time. She appears well-developed and well-nourished. She is cooperative.  HENT:  Head: Normocephalic and atraumatic.  Right Ear: Tympanic membrane normal.  Left Ear: Tympanic membrane normal.  Nose: Nose  normal.  Mouth/Throat: Oropharynx is clear and moist and mucous membranes are normal. No oropharyngeal exudate or posterior oropharyngeal erythema.  Eyes: Conjunctivae and EOM are normal. Pupils are equal, round, and reactive to light.  Neck: Neck supple. No JVD present. Carotid bruit is not present. No mass and no thyromegaly present.  Cardiovascular: Regular rhythm, normal heart sounds, intact distal pulses and normal pulses.  Exam reveals no gallop and no friction rub.   No murmur heard. Pulses:      Dorsalis pedis pulses are 2+ on the right side, and 2+ on the left side.  Pulmonary/Chest: Breath sounds normal. She has no wheezes. She has no rhonchi. She has no rales. Right breast exhibits no mass, no nipple discharge and no skin change. Left breast exhibits no mass, no nipple discharge and no skin change.  Abdominal: Soft. Bowel sounds are  normal. She exhibits no distension and no mass. There is no hepatosplenomegaly. There is no tenderness. There is no CVA tenderness.  Genitourinary: Rectum normal, vagina normal and uterus normal. Rectal exam shows no mass. Guaiac negative stool. No labial fusion. There is no lesion on the right labia. There is no lesion on the left labia. Cervix exhibits no motion tenderness. Right adnexum displays no mass, no tenderness and no fullness. Left adnexum displays no mass, no tenderness and no fullness. No erythema around the vagina.  Musculoskeletal:       No active synovitis to any joint.   DJD changes both hands Lymphadenopathy:       Right cervical: No superficial cervical adenopathy present.      Left cervical: No superficial cervical adenopathy present.       Right axillary: No pectoral and no lateral adenopathy present.       Left axillary: No pectoral and no lateral adenopathy present.      Right: No inguinal adenopathy present.       Left: No inguinal adenopathy present.  Neurological: She is alert and oriented to person, place, and time. She has normal strength and normal reflexes. No cranial nerve deficit or sensory deficit. She displays a negative Romberg sign. Coordination and gait normal.  Skin: Skin is warm and dry. No abrasion, no bruising, no ecchymosis and no rash noted. No cyanosis. Nails show no clubbing.  Psychiatric: She has a normal mood and affect. Her speech is normal and behavior is normal.          Assessment & Plan:   Health Maintenance:  MM and pap today.  Discussed Zostavax.  DASH diet given.  Will schedule  DEXA.   Advised 81 mg coated ASA  FH of breast ca in mother and neice  MM today and advised not to skip her MM.  Given info on BRCA testing  She wishes to check with [facility] regarding cost   Hypothyroidism:  TSH normal continue meds  Atypical  chest pressure  EKG norma today with sinus bradycardia.  Will refer to cardiology for risk stratification.  Advised to  begin 81 mg coated ASA daily and to get urgent eval if chest synptoms return  Osteopenia  Advised calcium Vitamin D. Will schedule DEXA  DJD:  Asymptomatic now     Assessment & Plan:

## 2013-04-26 ENCOUNTER — Encounter: Payer: Self-pay | Admitting: *Deleted

## 2013-05-08 ENCOUNTER — Ambulatory Visit: Payer: 59 | Admitting: Cardiovascular Disease

## 2013-05-30 ENCOUNTER — Ambulatory Visit (HOSPITAL_COMMUNITY): Payer: 59

## 2013-05-31 ENCOUNTER — Ambulatory Visit (HOSPITAL_BASED_OUTPATIENT_CLINIC_OR_DEPARTMENT_OTHER)
Admission: RE | Admit: 2013-05-31 | Discharge: 2013-05-31 | Disposition: A | Discharge status: Home / Self Care | Payer: 59 | Source: Ambulatory Visit | Attending: Internal Medicine | Admitting: Internal Medicine

## 2013-05-31 DIAGNOSIS — Z1231 Encounter for screening mammogram for malignant neoplasm of breast: Secondary | ICD-10-CM | POA: Insufficient documentation

## 2013-05-31 DIAGNOSIS — Z139 Encounter for screening, unspecified: Secondary | ICD-10-CM

## 2013-06-01 ENCOUNTER — Ambulatory Visit: Payer: 59 | Admitting: Cardiovascular Disease

## 2013-06-05 ENCOUNTER — Ambulatory Visit (HOSPITAL_COMMUNITY): Payer: 59

## 2013-06-07 ENCOUNTER — Ambulatory Visit (HOSPITAL_COMMUNITY): Payer: 59

## 2013-06-08 ENCOUNTER — Encounter: Payer: Self-pay | Admitting: *Deleted

## 2013-06-16 ENCOUNTER — Ambulatory Visit: Payer: 59 | Admitting: Cardiovascular Disease

## 2013-08-22 ENCOUNTER — Ambulatory Visit (HOSPITAL_COMMUNITY)
Admission: RE | Admit: 2013-08-22 | Discharge: 2013-08-22 | Disposition: A | Discharge status: Home / Self Care | Payer: 59 | Source: Ambulatory Visit | Attending: Internal Medicine | Admitting: Internal Medicine

## 2013-08-22 ENCOUNTER — Encounter: Payer: Self-pay | Admitting: Internal Medicine

## 2013-08-22 DIAGNOSIS — Z13820 Encounter for screening for osteoporosis: Secondary | ICD-10-CM | POA: Insufficient documentation

## 2013-08-22 DIAGNOSIS — Z780 Asymptomatic menopausal state: Secondary | ICD-10-CM | POA: Insufficient documentation

## 2013-08-22 DIAGNOSIS — Z9289 Personal history of other medical treatment: Secondary | ICD-10-CM | POA: Insufficient documentation

## 2013-08-22 DIAGNOSIS — M8580 Other specified disorders of bone density and structure, unspecified site: Secondary | ICD-10-CM

## 2013-08-24 ENCOUNTER — Telehealth: Payer: Self-pay | Admitting: *Deleted

## 2013-08-24 NOTE — Telephone Encounter (Signed)
Notified pt of bone density results via VM

## 2013-08-24 NOTE — Telephone Encounter (Signed)
Message copied by Mathews Robinsons on Thu Aug 24, 2013  4:22 PM ------      Message from: Raechel Chute D      Created: Tue Aug 22, 2013  2:11 PM       Karen Kitchens            Call pt and let her know that her bone density shows osteopenia .  Advise her to take her daily calcium and vitamin D              Will recheck her bone density in 2 years ------

## 2013-08-29 ENCOUNTER — Telehealth: Payer: Self-pay | Admitting: *Deleted

## 2013-08-29 NOTE — Telephone Encounter (Signed)
Returning pt phone call regarding an appt for arm pain. LVM message

## 2013-09-01 ENCOUNTER — Encounter: Payer: Self-pay | Admitting: *Deleted

## 2013-09-19 ENCOUNTER — Ambulatory Visit (INDEPENDENT_AMBULATORY_CARE_PROVIDER_SITE_OTHER): Payer: 59 | Admitting: Internal Medicine

## 2013-09-19 ENCOUNTER — Encounter: Payer: Self-pay | Admitting: Internal Medicine

## 2013-09-19 VITALS — HR 71 | Temp 98.2°F | Resp 18 | Wt 134.0 lb

## 2013-09-19 DIAGNOSIS — M6580 Other synovitis and tenosynovitis, unspecified site: Secondary | ICD-10-CM

## 2013-09-19 DIAGNOSIS — M778 Other enthesopathies, not elsewhere classified: Secondary | ICD-10-CM

## 2013-09-19 MED ORDER — NABUMETONE 500 MG PO TABS: 500.0000 mg | ORAL_TABLET | Freq: Two times a day (BID) | ORAL | Status: DC

## 2013-09-19 NOTE — Progress Notes (Signed)
Subjective:    Patient ID: Tammy Spears, female    DOB: [birth year], 62 y.o.   MRN: [id number]  HPI  Tammy Spears  Notes several weeks of L elbow pain lateral surface that sometimes radiates to mid deltoid muscle.  No injury but frequently lifts her grandchile  20-25 lbs to carry   Ibuprofen helps  No neck pain  No numbness motor weakness or paresthesias  "achy pain"    No Known Allergies Past Medical History  Diagnosis Date  . Hypothyroidism   . Arthritis   . Uterine fibroid   . Hyperlipidemia   . Osteopenia    Past Surgical History  Procedure Laterality Date  . Thyroidectomy, partial      goiter   History   Social History  . Marital Status: Married    Spouse Name: Merlyn Albert    Number of Children: 2  . Years of Education: Masters   Occupational History  . Retired    Social History Main Topics  . Smoking status: Never Smoker   . Smokeless tobacco: Never Used  . Alcohol Use: 1.2 oz/week    2 Glasses of wine per week  . Drug Use: No  . Sexual Activity: Yes    Partners: Male   Other Topics Concern  . Not on file   Social History Narrative  . No narrative on file   Family History  Problem Relation Age of Onset  . Heart failure Mother   . Breast cancer Mother   . Heart attack Father    Patient Active Problem List   Diagnosis Date Noted  . H/O bone density study 08/22/2013  . Family history of breast cancer in mother 04/24/2013  . Unspecified hypothyroidism 05/24/2012  . Fibroids 05/24/2012  . Other and unspecified hyperlipidemia 05/24/2012  . DJD (degenerative joint disease) 05/24/2012  . Osteopenia 05/24/2012   Current Outpatient Prescriptions on File Prior to Visit  Medication Sig Dispense Refill  . Calcium Carbonate-Vitamin D (CALCIUM 500 + D PO) Take 1 tablet by mouth daily.        . fish oil-omega-3 fatty acids 1000 MG capsule Take 1 g by mouth daily.      Marland Kitchen glucosamine-chondroitin 500-400 MG tablet Take 1 tablet by mouth daily.        . Multiple  Vitamin (MULTIVITAMIN) tablet Take 1 tablet by mouth daily.        Marland Kitchen levothyroxine (SYNTHROID, LEVOTHROID) 100 MCG tablet Take 1 tablet (100 mcg total) by mouth daily.  90 tablet  3   Current Facility-Administered Medications on File Prior to Visit  Medication Dose Route Frequency Provider Last Rate Last Dose  . TDaP (BOOSTRIX) injection 0.5 mL  0.5 mL Intramuscular Once Kendrick Ranch, MD           Review of Systems    see HPI Objective:   Physical Exam Physical Exam  Nursing note and vitals reviewed.  Constitutional: She is oriented to person, place, and time. She appears well-developed and well-nourished.  HENT:  Head: Normocephalic and atraumatic.  Cardiovascular: Normal rate and regular rhythm. Exam reveals no gallop and no friction rub.  No murmur heard.  Pulmonary/Chest: Breath sounds normal. She has no wheezes. She has no rales.  Neurological: She is alert and oriented to person, place, and time.  Ext  Good ROm tender along lateral epicondyle of L elbow.  No neck pain with ROM Skin: Skin is warm and dry.  Psychiatric: She has a normal mood and affect. Her behavior is  normal.              Assessment & Plan:  L elbow tendinitis  Will give Relafen 500 bid for 3 weeks  No carrying or lifting with Left arm for next 3 weeks.  Velcro elbow support    If not better pt to call

## 2014-05-01 ENCOUNTER — Encounter: Payer: Self-pay | Admitting: Internal Medicine

## 2014-05-01 ENCOUNTER — Ambulatory Visit (INDEPENDENT_AMBULATORY_CARE_PROVIDER_SITE_OTHER): Payer: Medicare Other | Admitting: Internal Medicine

## 2014-05-01 VITALS — BP 115/63 | HR 84 | Resp 16 | Ht 62.0 in | Wt 135.0 lb

## 2014-05-01 DIAGNOSIS — Z803 Family history of malignant neoplasm of breast: Secondary | ICD-10-CM

## 2014-05-01 DIAGNOSIS — M1990 Unspecified osteoarthritis, unspecified site: Secondary | ICD-10-CM

## 2014-05-01 DIAGNOSIS — M899 Disorder of bone, unspecified: Secondary | ICD-10-CM

## 2014-05-01 DIAGNOSIS — M8580 Other specified disorders of bone density and structure, unspecified site: Secondary | ICD-10-CM

## 2014-05-01 DIAGNOSIS — E039 Hypothyroidism, unspecified: Secondary | ICD-10-CM

## 2014-05-01 DIAGNOSIS — Z23 Encounter for immunization: Secondary | ICD-10-CM

## 2014-05-01 DIAGNOSIS — E785 Hyperlipidemia, unspecified: Secondary | ICD-10-CM

## 2014-05-01 DIAGNOSIS — M949 Disorder of cartilage, unspecified: Secondary | ICD-10-CM

## 2014-05-01 DIAGNOSIS — Z0000 Encounter for general adult medical examination without abnormal findings: Secondary | ICD-10-CM

## 2014-05-01 LAB — COMPREHENSIVE METABOLIC PANEL
ALT: 18 U/L (ref 0–35)
AST: 21 U/L (ref 0–37)
Albumin: 4.1 g/dL (ref 3.5–5.2)
Alkaline Phosphatase: 68 U/L (ref 39–117)
BUN: 16 mg/dL (ref 6–23)
CO2: 27 meq/L (ref 19–32)
CREATININE: 0.8 mg/dL (ref 0.50–1.10)
Calcium: 9.4 mg/dL (ref 8.4–10.5)
Chloride: 108 mEq/L (ref 96–112)
Glucose, Bld: 89 mg/dL (ref 70–99)
Potassium: 4.5 mEq/L (ref 3.5–5.3)
SODIUM: 142 meq/L (ref 135–145)
TOTAL PROTEIN: 6.6 g/dL (ref 6.0–8.3)
Total Bilirubin: 0.4 mg/dL (ref 0.2–1.2)

## 2014-05-01 LAB — CBC WITH DIFFERENTIAL/PLATELET
BASOS ABS: 0 10*3/uL (ref 0.0–0.1)
BASOS PCT: 0 % (ref 0–1)
EOS PCT: 1 % (ref 0–5)
Eosinophils Absolute: 0 10*3/uL (ref 0.0–0.7)
HEMATOCRIT: 38.8 % (ref 36.0–46.0)
Hemoglobin: 13.1 g/dL (ref 12.0–15.0)
Lymphocytes Relative: 34 % (ref 12–46)
Lymphs Abs: 1.6 10*3/uL (ref 0.7–4.0)
MCH: 31.6 pg (ref 26.0–34.0)
MCHC: 33.8 g/dL (ref 30.0–36.0)
MCV: 93.5 fL (ref 78.0–100.0)
Monocytes Absolute: 0.3 10*3/uL (ref 0.1–1.0)
Monocytes Relative: 7 % (ref 3–12)
Neutro Abs: 2.8 10*3/uL (ref 1.7–7.7)
Neutrophils Relative %: 58 % (ref 43–77)
PLATELETS: 197 10*3/uL (ref 150–400)
RBC: 4.15 MIL/uL (ref 3.87–5.11)
RDW: 13.4 % (ref 11.5–15.5)
WBC: 4.8 10*3/uL (ref 4.0–10.5)

## 2014-05-01 LAB — POCT URINALYSIS DIPSTICK
Bilirubin, UA: NEGATIVE
Blood, UA: NEGATIVE
Glucose, UA: NEGATIVE
Ketones, UA: NEGATIVE
Leukocytes, UA: NEGATIVE
Nitrite, UA: NEGATIVE
PH UA: 5
PROTEIN UA: NEGATIVE
Spec Grav, UA: 1.02
UROBILINOGEN UA: 0.2

## 2014-05-01 LAB — LIPID PANEL
CHOL/HDL RATIO: 3 ratio
CHOLESTEROL: 181 mg/dL (ref 0–200)
HDL: 60 mg/dL (ref >39)
LDL Cholesterol: 92 mg/dL (ref 0–99)
Triglycerides: 146 mg/dL (ref <150)
VLDL: 29 mg/dL (ref 0–40)

## 2014-05-01 MED ORDER — FLUCONAZOLE 150 MG PO TABS: 150.0000 mg | ORAL_TABLET | Freq: Once | ORAL | Status: AC

## 2014-05-01 NOTE — Progress Notes (Signed)
Subjective:    Patient ID: Tammy Spears, female    DOB: Sep 16, 1951, 63 y.o.   MRN: [id number]  HPI Chong Sicilian is here for Welcome to Medicare exam   Doing well  She thinks she has a vaginal yeast infection.  Very small itchy white discharge  .    Wellness  Pap neg 2014,  Due for mm in July.  colonscopy done 2013  .  Ptis a nonsmoker  No Known Allergies Past Medical History  Diagnosis Date  . Hypothyroidism   . Arthritis   . Uterine fibroid   . Hyperlipidemia   . Osteopenia    Past Surgical History  Procedure Laterality Date  . Thyroidectomy, partial      goiter   History   Social History  . Marital Status: Married    Spouse Name: Josph Macho    Number of Children: 2  . Years of Education: Masters   Occupational History  . Retired    Social History Main Topics  . Smoking status: Never Smoker   . Smokeless tobacco: Never Used  . Alcohol Use: 1.2 oz/week    2 Glasses of wine per week  . Drug Use: No  . Sexual Activity: Yes    Partners: Male    Birth Control/ Protection: Post-menopausal   Other Topics Concern  . Not on file   Social History Narrative  . No narrative on file   Family History  Problem Relation Age of Onset  . Heart failure Mother   . Breast cancer Mother   . Heart attack Father    Patient Active Problem List   Diagnosis Date Noted  . H/O bone density study 08/22/2013  . Family history of breast cancer in mother 04/24/2013  . Unspecified hypothyroidism 05/24/2012  . Fibroids 05/24/2012  . Other and unspecified hyperlipidemia 05/24/2012  . DJD (degenerative joint disease) 05/24/2012  . Osteopenia 05/24/2012   Current Outpatient Prescriptions on File Prior to Visit  Medication Sig Dispense Refill  . Calcium Carbonate-Vitamin D (CALCIUM 500 + D PO) Take 1 tablet by mouth daily.        . fish oil-omega-3 fatty acids 1000 MG capsule Take 1 g by mouth daily.      Marland Kitchen glucosamine-chondroitin 500-400 MG tablet Take 1 tablet by mouth daily.        Marland Kitchen  levothyroxine (SYNTHROID, LEVOTHROID) 100 MCG tablet Take 1 tablet (100 mcg total) by mouth daily.  90 tablet  3  . Multiple Vitamin (MULTIVITAMIN) tablet Take 1 tablet by mouth daily.        Marland Kitchen aspirin EC 81 MG tablet Take 81 mg by mouth daily.       Current Facility-Administered Medications on File Prior to Visit  Medication Dose Route Frequency Provider Last Rate Last Dose  . TDaP (BOOSTRIX) injection 0.5 mL  0.5 mL Intramuscular Once Lanice Shirts, MD           Review of Systems     Objective:   Physical Exam Physical Exam  Nursing note and vitals reviewed.  Constitutional: She is oriented to person, place, and time. She appears well-developed and well-nourished.  HENT:  Head: Normocephalic and atraumatic.  Right Ear: Tympanic membrane and ear canal normal. No drainage. Tympanic membrane is not injected and not erythematous.  Left Ear: Tympanic membrane and ear canal normal. No drainage. Tympanic membrane is not injected and not erythematous.  Nose: Nose normal. Right sinus exhibits no maxillary sinus tenderness and no frontal sinus tenderness. Left  sinus exhibits no maxillary sinus tenderness and no frontal sinus tenderness.  Mouth/Throat: Oropharynx is clear and moist. No oral lesions. No oropharyngeal exudate.  Eyes: Conjunctivae and EOM are normal. Pupils are equal, round, and reactive to light.  Neck: Normal range of motion. Neck supple. No JVD present. Carotid bruit is not present. No mass and no thyromegaly present.  Cardiovascular: Normal rate, regular rhythm, S1 normal, S2 normal and intact distal pulses. Exam reveals no gallop and no friction rub.  No murmur heard.  Pulses:  Carotid pulses are 2+ on the right side, and 2+ on the left side.  Dorsalis pedis pulses are 2+ on the right side, and 2+ on the left side.  No carotid bruit. No LE edema  Pulmonary/Chest: Breath sounds normal. She has no wheezes. She has no rales. She exhibits no tenderness.   Breast :  No  discrete mass no nipple discharge no axillary adenopathy bilaterally Abdominal: Soft. Bowel sounds are normal. She exhibits no distension and no mass. There is no hepatosplenomegaly. There is no tenderness. There is no CVA tenderness.   Rectal no mass guaiac neg Musculoskeletal: Normal range of motion.  No active synovitis to joints.  Lymphadenopathy:  She has no cervical adenopathy.  She has no axillary adenopathy.  Right: No inguinal and no supraclavicular adenopathy present.  Left: No inguinal and no supraclavicular adenopathy present.  Neurological: She is alert and oriented to person, place, and time. She has normal strength and normal reflexes. She displays no tremor. No cranial nerve deficit or sensory deficit. Coordination and gait normal.  Skin: Skin is warm and dry. No rash noted. No cyanosis. Nails show no clubbing.  Psychiatric: She has a normal mood and affect. Her speech is normal and behavior is normal. Cognition and memory are normal.          Assessment & Plan:  Wellness exam  :  Advised 3d Mm in July,    UTD with others.   Prevnar given today  Counseled lung cancer screening  Pt is a nonsmoker  Hypothyroidsim will check today  Continue meds  Ostreopenia  Check vitamin D today  conitnue calcium and vitamin D  Hyperlipidemia  Will check today  DJD  FH breast cancer in mother  Niece is BRCA neg  Pt declines BRCA testing herself

## 2014-05-02 DIAGNOSIS — Z23 Encounter for immunization: Secondary | ICD-10-CM | POA: Insufficient documentation

## 2014-05-02 LAB — VITAMIN D 25 HYDROXY (VIT D DEFICIENCY, FRACTURES): VIT D 25 HYDROXY: 58 ng/mL (ref 30–89)

## 2014-05-02 LAB — TSH: TSH: 0.469 u[IU]/mL (ref 0.350–4.500)

## 2014-05-03 ENCOUNTER — Encounter: Payer: Self-pay | Admitting: *Deleted

## 2014-06-25 ENCOUNTER — Other Ambulatory Visit: Payer: Self-pay | Admitting: Internal Medicine

## 2014-06-25 DIAGNOSIS — Z1231 Encounter for screening mammogram for malignant neoplasm of breast: Secondary | ICD-10-CM

## 2014-06-28 ENCOUNTER — Other Ambulatory Visit: Payer: Self-pay | Admitting: Internal Medicine

## 2014-06-28 NOTE — Telephone Encounter (Signed)
Requested Medications     Medication name:  Name from pharmacy:  SYNTHROID 100 MCG tablet  SYNTHROID 100 MCG TABLET    Sig: TAKE 1 TABLET (100 MCG TOTAL) BY MOUTH DAILY.    Dispense: 90 tablet Refills: 2 DAW Start: 06/28/2014  Class: Normal    Requested on: 04/24/2013    Originally ordered on: 04/15/2011 Last refill: 04/02/2014 Order History and Details

## 2014-06-29 ENCOUNTER — Ambulatory Visit (HOSPITAL_BASED_OUTPATIENT_CLINIC_OR_DEPARTMENT_OTHER)
Admission: RE | Admit: 2014-06-29 | Discharge: 2014-06-29 | Disposition: A | Discharge status: Home / Self Care | Payer: Medicare Other | Source: Ambulatory Visit | Attending: Internal Medicine | Admitting: Internal Medicine

## 2014-06-29 DIAGNOSIS — Z1231 Encounter for screening mammogram for malignant neoplasm of breast: Secondary | ICD-10-CM | POA: Diagnosis present

## 2014-08-24 ENCOUNTER — Ambulatory Visit (INDEPENDENT_AMBULATORY_CARE_PROVIDER_SITE_OTHER): Payer: Medicare Other | Admitting: *Deleted

## 2014-08-24 DIAGNOSIS — Z23 Encounter for immunization: Secondary | ICD-10-CM

## 2014-09-17 ENCOUNTER — Encounter: Payer: Self-pay | Admitting: Internal Medicine

## 2015-03-11 ENCOUNTER — Other Ambulatory Visit: Payer: Self-pay | Admitting: *Deleted

## 2015-03-11 MED ORDER — SYNTHROID 100 MCG PO TABS: ORAL_TABLET | ORAL | Status: AC

## 2015-03-11 NOTE — Telephone Encounter (Signed)
Refill request

## 2015-03-26 ENCOUNTER — Other Ambulatory Visit: Payer: Self-pay | Admitting: Internal Medicine

## 2015-03-26 NOTE — Telephone Encounter (Signed)
Refill request
